# Patient Record
Sex: Female | Born: 1975 | Hispanic: No | Marital: Married | Smoking: Never smoker
Health system: Southern US, Community
[De-identification: ages and names within clinical notes are randomized; demographics above are authoritative.]

---

## 2001-10-23 ENCOUNTER — Emergency Department (HOSPITAL_COMMUNITY): Admission: EM | Admit: 2001-10-23 | Discharge: 2001-10-24 | Payer: Self-pay | Admitting: Emergency Medicine

## 2001-10-24 ENCOUNTER — Encounter: Payer: Self-pay | Admitting: Emergency Medicine

## 2001-10-25 ENCOUNTER — Emergency Department (HOSPITAL_COMMUNITY): Admission: EM | Admit: 2001-10-25 | Discharge: 2001-10-25 | Payer: Self-pay | Admitting: Emergency Medicine

## 2001-11-02 ENCOUNTER — Emergency Department (HOSPITAL_COMMUNITY): Admission: EM | Admit: 2001-11-02 | Discharge: 2001-11-02 | Payer: Self-pay | Admitting: Physical Therapy

## 2003-05-26 ENCOUNTER — Emergency Department (HOSPITAL_COMMUNITY): Admission: EM | Admit: 2003-05-26 | Discharge: 2003-05-26 | Payer: Self-pay | Admitting: Emergency Medicine

## 2005-04-15 ENCOUNTER — Ambulatory Visit (HOSPITAL_COMMUNITY): Admission: RE | Admit: 2005-04-15 | Discharge: 2005-04-15 | Payer: Self-pay | Admitting: Family Medicine

## 2005-04-15 ENCOUNTER — Emergency Department (HOSPITAL_COMMUNITY): Admission: EM | Admit: 2005-04-15 | Discharge: 2005-04-15 | Payer: Self-pay | Admitting: Family Medicine

## 2005-04-17 ENCOUNTER — Emergency Department (HOSPITAL_COMMUNITY): Admission: EM | Admit: 2005-04-17 | Discharge: 2005-04-17 | Payer: Self-pay | Admitting: Emergency Medicine

## 2005-04-20 ENCOUNTER — Emergency Department (HOSPITAL_COMMUNITY): Admission: EM | Admit: 2005-04-20 | Discharge: 2005-04-20 | Payer: Self-pay | Admitting: Emergency Medicine

## 2011-12-21 ENCOUNTER — Emergency Department (HOSPITAL_BASED_OUTPATIENT_CLINIC_OR_DEPARTMENT_OTHER)
Admission: EM | Admit: 2011-12-21 | Discharge: 2011-12-21 | Disposition: A | Discharge status: Home / Self Care | Payer: Self-pay | Attending: Emergency Medicine | Admitting: Emergency Medicine

## 2011-12-21 ENCOUNTER — Emergency Department (INDEPENDENT_AMBULATORY_CARE_PROVIDER_SITE_OTHER): Payer: Self-pay

## 2011-12-21 ENCOUNTER — Encounter (HOSPITAL_BASED_OUTPATIENT_CLINIC_OR_DEPARTMENT_OTHER): Payer: Self-pay

## 2011-12-21 DIAGNOSIS — D649 Anemia, unspecified: Secondary | ICD-10-CM | POA: Insufficient documentation

## 2011-12-21 DIAGNOSIS — J40 Bronchitis, not specified as acute or chronic: Secondary | ICD-10-CM | POA: Insufficient documentation

## 2011-12-21 DIAGNOSIS — R5383 Other fatigue: Secondary | ICD-10-CM

## 2011-12-21 DIAGNOSIS — R509 Fever, unspecified: Secondary | ICD-10-CM

## 2011-12-21 DIAGNOSIS — R0602 Shortness of breath: Secondary | ICD-10-CM | POA: Insufficient documentation

## 2011-12-21 DIAGNOSIS — E876 Hypokalemia: Secondary | ICD-10-CM | POA: Insufficient documentation

## 2011-12-21 DIAGNOSIS — R05 Cough: Secondary | ICD-10-CM

## 2011-12-21 LAB — CBC
HCT: 33.6 % — ABNORMAL LOW (ref 36.0–46.0)
Hemoglobin: 11.4 g/dL — ABNORMAL LOW (ref 12.0–15.0)
MCH: 27 pg (ref 26.0–34.0)
MCHC: 33.9 g/dL (ref 30.0–36.0)
RDW: 14.6 % (ref 11.5–15.5)

## 2011-12-21 LAB — PREGNANCY, URINE: Preg Test, Ur: NEGATIVE

## 2011-12-21 LAB — BASIC METABOLIC PANEL
Chloride: 104 mEq/L (ref 96–112)
GFR calc Af Amer: >90 mL/min (ref >90)
Potassium: 2.8 mEq/L — ABNORMAL LOW (ref 3.5–5.1)

## 2011-12-21 LAB — DIFFERENTIAL
Band Neutrophils: 11 % — ABNORMAL HIGH (ref 0–10)
Basophils Absolute: 0 10*3/uL (ref 0.0–0.1)
Basophils Relative: 0 % (ref 0–1)
Myelocytes: 0 %
Neutrophils Relative %: 61 % (ref 43–77)
Promyelocytes Absolute: 0 %

## 2011-12-21 MED ORDER — ALBUTEROL SULFATE HFA 108 (90 BASE) MCG/ACT IN AERS: 1.0000 | INHALATION_SPRAY | Freq: Four times a day (QID) | RESPIRATORY_TRACT | Status: AC | PRN

## 2011-12-21 MED ORDER — POTASSIUM CHLORIDE CRYS ER 20 MEQ PO TBCR: 20.0000 meq | EXTENDED_RELEASE_TABLET | Freq: Two times a day (BID) | ORAL | Status: AC

## 2011-12-21 MED ORDER — ALBUTEROL SULFATE HFA 108 (90 BASE) MCG/ACT IN AERS
2.0000 | INHALATION_SPRAY | Freq: Once | RESPIRATORY_TRACT | Status: AC
  Administered 2011-12-21: 2 via RESPIRATORY_TRACT

## 2011-12-21 MED ORDER — ALBUTEROL SULFATE HFA 108 (90 BASE) MCG/ACT IN AERS
INHALATION_SPRAY | RESPIRATORY_TRACT | Status: AC
  Filled 2011-12-21: qty 6.7

## 2011-12-21 MED ORDER — POTASSIUM CHLORIDE CRYS ER 20 MEQ PO TBCR
40.0000 meq | EXTENDED_RELEASE_TABLET | Freq: Once | ORAL | Status: AC
  Administered 2011-12-21: 40 meq via ORAL
  Filled 2011-12-21: qty 2

## 2011-12-21 MED ORDER — PRENATAL COMPLETE 14-0.4 MG PO TABS: 1.0000 | ORAL_TABLET | ORAL | Status: AC

## 2011-12-21 MED ORDER — SODIUM CHLORIDE 0.9 % IV SOLN: Freq: Once | INTRAVENOUS | Status: DC

## 2011-12-21 NOTE — ED Notes (Signed)
Pt c/o SOB onset 3 days ago.  Family states pt has dry cough for past 3 days as well.  Pt states she has been taking meds for "bacterial infection in stomach".  Pt states she has been nauseated, denies emesis.  Pt also states she has felt febrile with diarrhea for past month.  Denies blood.

## 2011-12-21 NOTE — ED Provider Notes (Signed)
History     CSN: [id number]  Arrival date & time 12/21/11  1110   First MD Initiated Contact with Patient 12/21/11 1212      Chief Complaint  Patient presents with  . Shortness of Breath    (Consider location/radiation/quality/duration/timing/severity/associated sxs/prior treatment) Patient is a 36 y.o. female presenting with cough. The history is provided by the patient. No language interpreter was used.  Cough This is a new problem. The current episode started more than 1 week ago. The problem occurs constantly. The problem has been gradually worsening. The cough is non-productive. The maximum temperature recorded prior to her arrival was 100 to 100.9 F. Associated symptoms include chest pain, rhinorrhea and sore throat. She has tried nothing for the symptoms. The treatment provided no relief. She is not a smoker. Her past medical history does not include bronchitis.  Pt complains of cough and weakness  History reviewed. No pertinent past medical history.  History reviewed. No pertinent past surgical history.  No family history on file.  History  Substance Use Topics  . Smoking status: Never Smoker   . Smokeless tobacco: Not on file  . Alcohol Use: No    OB History    Grav Para Term Preterm Abortions TAB SAB Ect Mult Living                  Review of Systems  HENT: Positive for sore throat and rhinorrhea.   Respiratory: Positive for cough.   Cardiovascular: Positive for chest pain.  All other systems reviewed and are negative.    Allergies  Review of patient's allergies indicates no known allergies.  Home Medications   Current Outpatient Rx  Name Route Sig Dispense Refill  . AMOXICILLIN 500 MG PO CAPS Oral Take 500 mg by mouth 3 (three) times daily.    Marland Kitchen CIPROFLOXACIN HCL 500 MG PO TABS Oral Take 500 mg by mouth 2 (two) times daily.    . IBUPROFEN 800 MG PO TABS Oral Take 800 mg by mouth 3 (three) times daily.    Marland Kitchen OMEPRAZOLE 20 MG PO CPDR Oral Take 20 mg by  mouth daily.      BP 106/61  Pulse 98  Temp(Src) 100.8 F (38.2 C) (Oral)  SpO2 99%  LMP 12/08/2011  Physical Exam  Nursing note and vitals reviewed. Constitutional: She appears well-developed and well-nourished.  HENT:  Head: Normocephalic and atraumatic.  Right Ear: External ear normal.  Left Ear: External ear normal.  Nose: Nose normal.  Mouth/Throat: Oropharynx is clear and moist.  Eyes: Conjunctivae are normal. Pupils are equal, round, and reactive to light.  Neck: Normal range of motion. Neck supple.  Cardiovascular: Normal rate.   Pulmonary/Chest: Effort normal.  Abdominal: Soft.  Musculoskeletal: Normal range of motion.  Neurological: She is alert.  Skin: Skin is warm.    ED Course  Procedures (including critical care time)  Labs Reviewed  CBC - Abnormal; Notable for the following:    Hemoglobin 11.4 (*)    HCT 33.6 (*)    All other components within normal limits  DIFFERENTIAL - Abnormal; Notable for the following:    Monocytes Relative 14 (*)    Band Neutrophils 11 (*)    All other components within normal limits  BASIC METABOLIC PANEL - Abnormal; Notable for the following:    Potassium 2.8 (*) REPEATED TO VERIFY   All other components within normal limits  PREGNANCY, URINE   Dg Chest 2 View  12/21/2011  *RADIOLOGY REPORT*  Clinical Data: Fever, weakness, dry cough  CHEST - 2 VIEW  Comparison: None.  Findings: Normal cardiac silhouette and mediastinal contours.  No focal parenchymal opacities.  No pleural effusion or pneumothorax. No acute osseous abnormalities.  IMPRESSION: No acute cardiopulmonary disease.  Specifically, no evidence of pneumonia.  Original Report Authenticated By: Waynard Reeds, M.D.     No diagnosis found.    MDM  Pt given Iv fluids,          Langston Masker, [place] 12/21/11 1512

## 2011-12-21 NOTE — ED Notes (Signed)
RT assessed in triage for SOB = none noted. Family stated she had been sick for a few days, weak cough. BBS diminished but clear. RT will continue to monitor

## 2011-12-22 NOTE — ED Provider Notes (Signed)
Medical screening examination/treatment/procedure(s) were performed by non-physician practitioner and as supervising physician I was immediately available for consultation/collaboration.   Gwyneth Sprout, MD 12/22/11 [phone]

## 2013-07-02 IMAGING — CR DG CHEST 2V
2 series · 2 of 2 positions shown · non-contrast
Comparison: None.

CLINICAL DATA: Fever, weakness, dry cough

CHEST - 2 VIEW

[w chest pa]
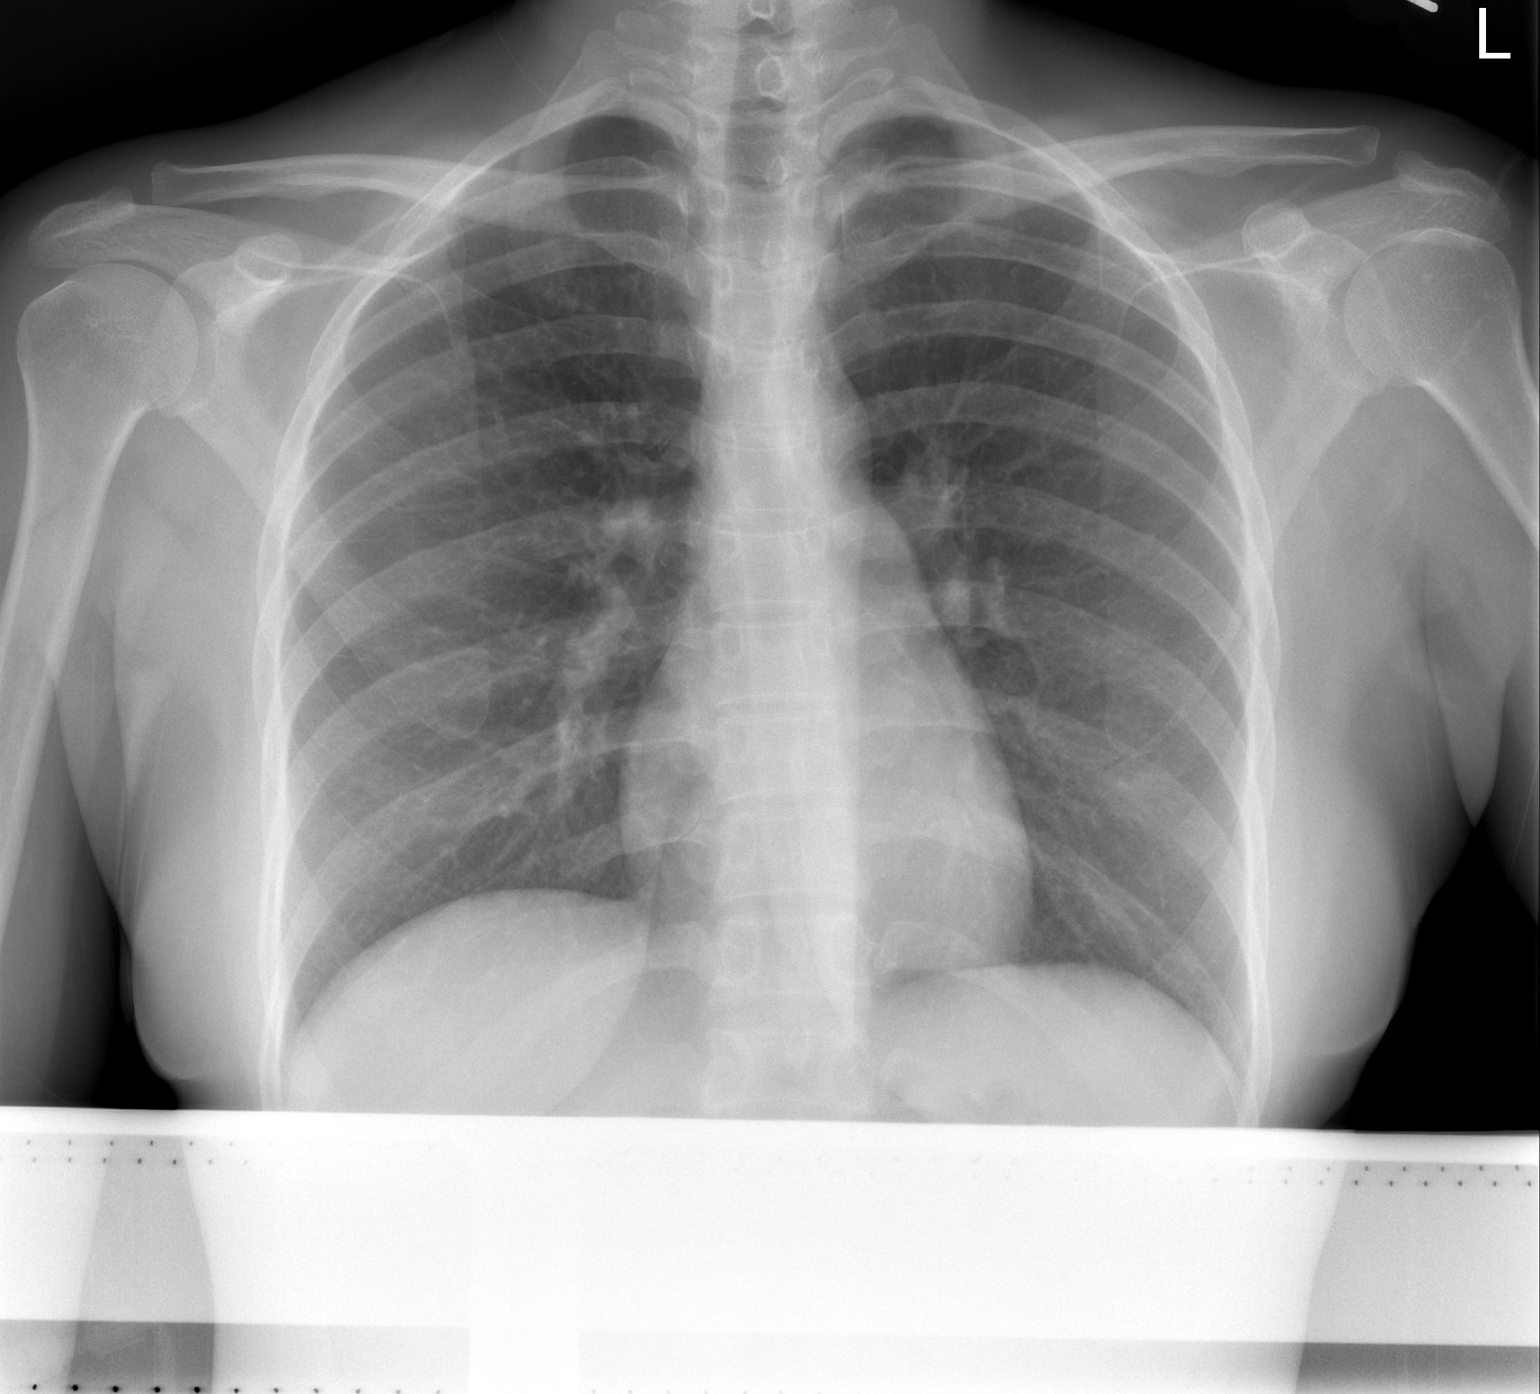

[w chest lat]
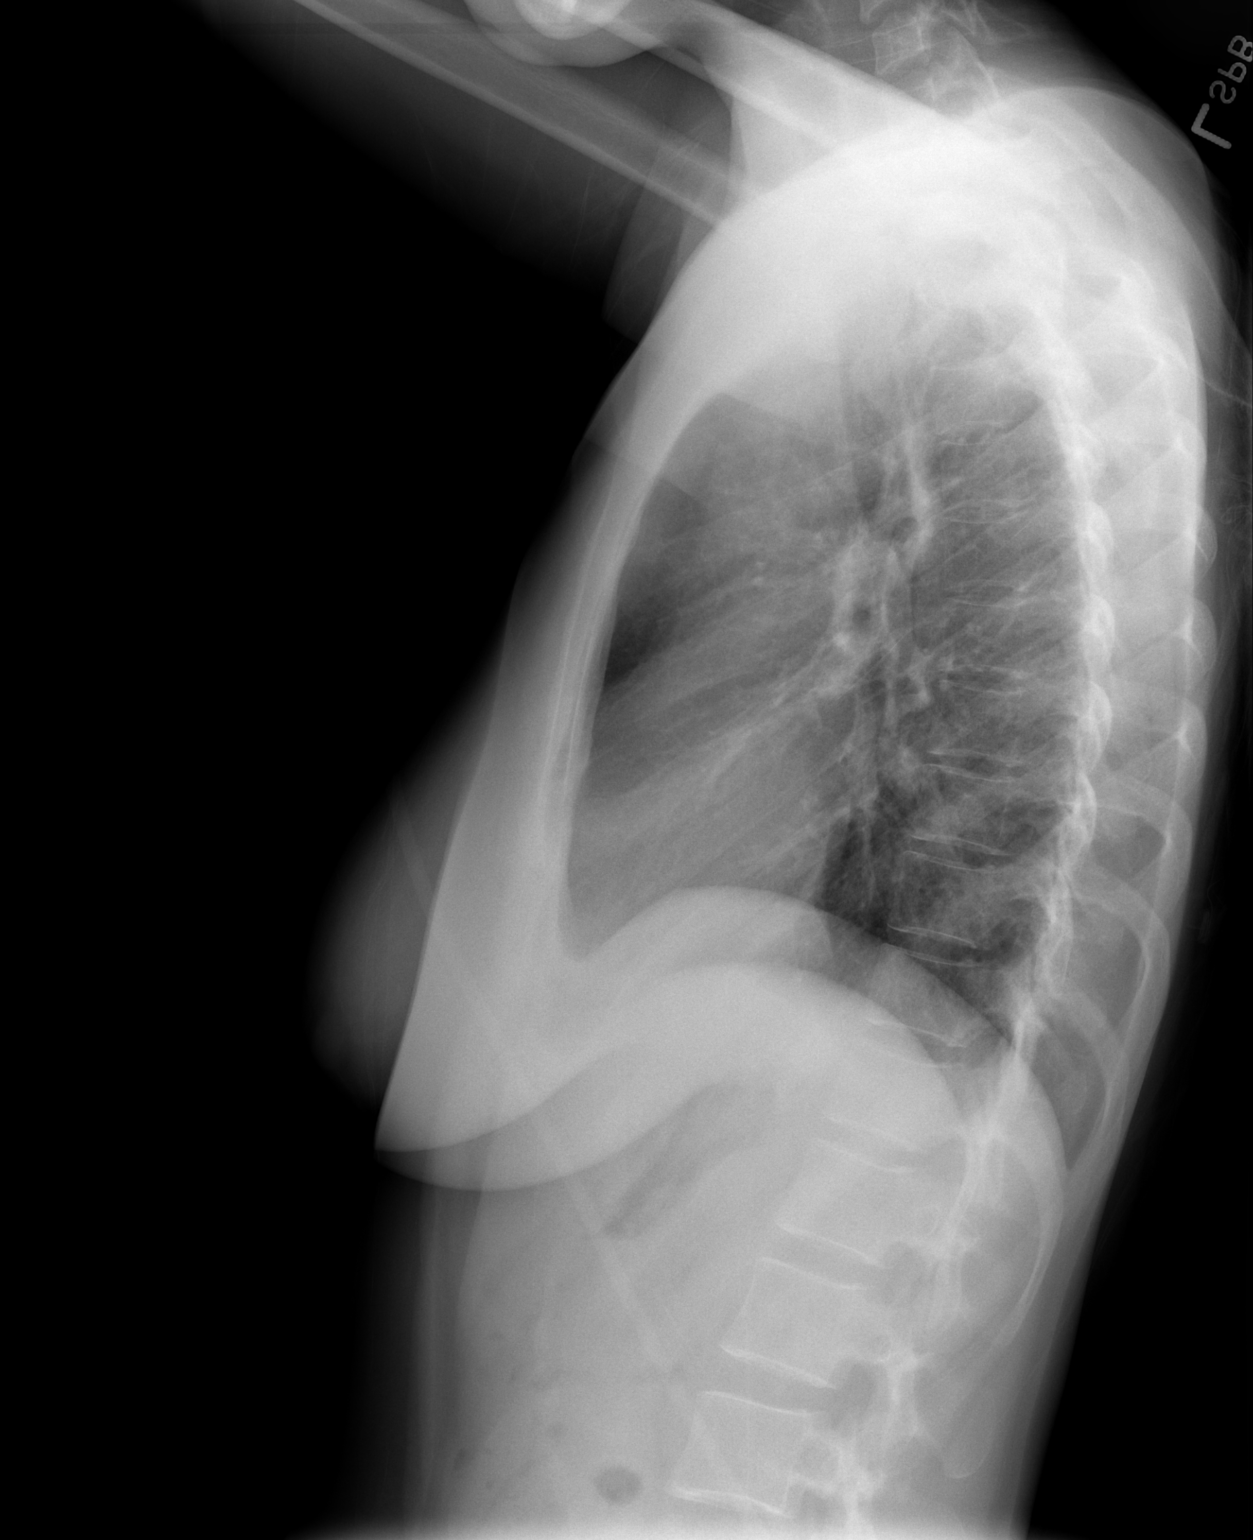

[2 of 2 positions shown; findings below may reference images not displayed]

FINDINGS: Normal cardiac silhouette and mediastinal contours.  No
focal parenchymal opacities.  No pleural effusion or pneumothorax.
No acute osseous abnormalities.
IMPRESSION: No acute cardiopulmonary disease.  Specifically, no evidence of
pneumonia.

## 2015-04-24 ENCOUNTER — Encounter (HOSPITAL_COMMUNITY): Payer: Self-pay | Admitting: Emergency Medicine

## 2015-04-24 ENCOUNTER — Emergency Department (INDEPENDENT_AMBULATORY_CARE_PROVIDER_SITE_OTHER)
Admission: EM | Admit: 2015-04-24 | Discharge: 2015-04-24 | Disposition: A | Discharge status: Home / Self Care | Payer: Self-pay | Source: Home / Self Care | Attending: Family Medicine | Admitting: Family Medicine

## 2015-04-24 ENCOUNTER — Emergency Department (INDEPENDENT_AMBULATORY_CARE_PROVIDER_SITE_OTHER): Payer: Self-pay

## 2015-04-24 DIAGNOSIS — J40 Bronchitis, not specified as acute or chronic: Secondary | ICD-10-CM

## 2015-04-24 MED ORDER — IPRATROPIUM-ALBUTEROL 0.5-2.5 (3) MG/3ML IN SOLN
RESPIRATORY_TRACT | Status: AC
  Filled 2015-04-24: qty 3

## 2015-04-24 MED ORDER — HYDROCODONE-ACETAMINOPHEN 5-325 MG PO TABS: 0.5000 | ORAL_TABLET | Freq: Every evening | ORAL | Status: AC | PRN

## 2015-04-24 MED ORDER — ALBUTEROL SULFATE HFA 108 (90 BASE) MCG/ACT IN AERS
2.0000 | INHALATION_SPRAY | Freq: Once | RESPIRATORY_TRACT | Status: AC
  Administered 2015-04-24: 2 via RESPIRATORY_TRACT

## 2015-04-24 MED ORDER — ACETAMINOPHEN 325 MG PO TABS
975.0000 mg | ORAL_TABLET | Freq: Once | ORAL | Status: AC
  Administered 2015-04-24: 975 mg via ORAL

## 2015-04-24 MED ORDER — ACETAMINOPHEN 325 MG PO TABS
ORAL_TABLET | ORAL | Status: AC
  Filled 2015-04-24: qty 3

## 2015-04-24 MED ORDER — PREDNISONE 50 MG PO TABS: 50.0000 mg | ORAL_TABLET | Freq: Every day | ORAL | Status: AC

## 2015-04-24 MED ORDER — IPRATROPIUM-ALBUTEROL 0.5-2.5 (3) MG/3ML IN SOLN
3.0000 mL | Freq: Once | RESPIRATORY_TRACT | Status: AC
  Administered 2015-04-24: 3 mL via RESPIRATORY_TRACT

## 2015-04-24 MED ORDER — ALBUTEROL SULFATE HFA 108 (90 BASE) MCG/ACT IN AERS
INHALATION_SPRAY | RESPIRATORY_TRACT | Status: AC
  Filled 2015-04-24: qty 6.7

## 2015-04-24 MED ORDER — AEROCHAMBER PLUS W/MASK MISC
Status: AC
  Filled 2015-04-24: qty 1

## 2015-04-24 NOTE — ED Notes (Signed)
Back pain, cough, fever that started on saturday

## 2015-04-24 NOTE — Discharge Instructions (Signed)
Thank you for coming in today. °Call or go to the emergency room if you get worse, have trouble breathing, have chest pains, or palpitations.  ° °Bronquitis aguda °(Acute Bronchitis) °La bronquitis es una inflamación de las vías respiratorias que se extienden desde la tráquea hasta los pulmones (bronquios). La inflamación produce la formación de mucosidad. Esto produce tos, que es el síntoma más frecuente de la bronquitis.  °Cuando la bronquitis es aguda, generalmente comienza de manera súbita y desaparece luego de un par de semanas. El hábito de fumar, las alergias y el asma pueden empeorar la bronquitis. Los episodios repetidos de bronquitis pueden causar más problemas pulmonares.  °CAUSAS °La causa más frecuente de bronquitis aguda es el mismo virus que produce el resfrío. El virus puede propagarse de una persona a la otra (contagioso) a través de la tos y los estornudos, y al tocar objetos contaminados. °SIGNOS Y SÍNTOMAS  °· Tos. °· Fiebre. °· Tos con mucosidad. °· Dolores en el cuerpo. °· Congestión en el pecho. °· Escalofríos. °· Falta de aire. °· Dolor de garganta. °DIAGNÓSTICO  °La bronquitis aguda en general se diagnostica con un examen físico. El médico también le hará preguntas sobre su historia clínica. En algunos casos se indican otros estudios, como radiografías, para descartar otras enfermedades.  °TRATAMIENTO  °La bronquitis aguda generalmente desaparece en un par de semanas. Con frecuencia, no es necesario realizar un tratamiento. Los medicamentos se indican para aliviar la fiebre o la tos. Generalmente, no es necesario el uso de antibióticos, pero pueden recetarse en ciertas ocasiones. En algunos casos, se recomienda el uso de un inhalador para mejorar la falta de aire y controlar la tos. Un vaporizador de aire frío podrá ayudarlo a disolver las secreciones bronquiales y facilitar su eliminación.  °INSTRUCCIONES PARA EL CUIDADO EN EL HOGAR  °· Descanse lo suficiente. °· Beba líquidos en abundancia  para mantener la orina de color claro o amarillo pálido (excepto que padezca una enfermedad que requiera la restricción de líquidos). El aumento de líquidos puede ayudar a que las secreciones respiratorias (esputo) sean menos espesas y a reducir la congestión del pecho, y evitará la deshidratación. °· Tome los medicamentos solamente como se lo haya indicado el médico. °· Si le recetaron antibióticos, asegúrese de terminarlos, incluso si comienza a sentirse mejor. °· Evite fumar o aspirar el humo de otros fumadores. La exposición al humo del cigarrillo o a irritantes químicos hará que la bronquitis empeore. Si fuma, considere el uso de goma de mascar o la aplicación de parches en la piel que contengan nicotina para aliviar los síntomas de abstinencia. Si deja de fumar, sus pulmones se curarán más rápido. °· Reduzca la probabilidad de otro episodio de bronquitis aguda lavando sus manos con frecuencia, evitando a las personas que tengan síntomas y tratando de no tocarse las manos con la boca, la nariz o los ojos. °· Concurra a todas las visitas de control como se lo haya indicado el médico. °SOLICITE ATENCIÓN MÉDICA SI: °Los síntomas no mejoran después de una semana de tratamiento.  °SOLICITE ATENCIÓN MÉDICA DE INMEDIATO SI: °· Comienza a tener fiebre o escalofríos cada vez más intensos. °· Siente dolor en el pecho. °· Le falta el aire de manera preocupante. °· La flema tiene sangre. °· Se deshidrata. °· Se desmaya o siente que va a desmayarse de forma repetida. °· Tiene vómitos que se repiten. °· Tiene un dolor de cabeza intenso. °ASEGÚRESE DE QUE:  °· Comprende estas instrucciones. °· Controlará su afección. °· Recibirá ayuda   de inmediato si no mejora o si empeora. °Document Released: 11/11/2005 Document Revised: 03/28/2014 °[employer]® Patient Information ©2015 [employer]. This information is not intended to replace advice given to you by your health care provider. Make sure you discuss any questions you have with  your health care provider. ° °

## 2015-04-24 NOTE — ED Provider Notes (Signed)
Susan Espinoza is a 39 y.o. female who presents to Urgent Care today for cough fever or back pain present for 3 days. Symptoms consistent with previous episodes of pneumonia. No vomiting or diarrhea. She's tried some home remedies which have not helped. She feels well otherwise. No chest pains or palpitations.   History reviewed. No pertinent past medical history. History reviewed. No pertinent past surgical history. History  Substance Use Topics  . Smoking status: Never Smoker   . Smokeless tobacco: Not on file  . Alcohol Use: No   ROS as above Medications: Current Facility-Administered Medications  Medication Dose Route Frequency Provider Last Rate Last Dose  . albuterol (PROVENTIL HFA;VENTOLIN HFA) 108 (90 BASE) MCG/ACT inhaler 2 puff  2 puff Inhalation Once Rodolph BongEvan S Shelvy Heckert, MD       Current Outpatient Prescriptions  Medication Sig Dispense Refill  . albuterol (PROVENTIL HFA;VENTOLIN HFA) 108 (90 BASE) MCG/ACT inhaler Inhale 1-2 puffs into the lungs every 6 (six) hours as needed for wheezing. 1 Inhaler 0  . HYDROcodone-acetaminophen (NORCO/VICODIN) 5-325 MG per tablet Take 0.5 tablets by mouth at bedtime as needed (cough). 6 tablet 0  . omeprazole (PRILOSEC) 20 MG capsule Take 20 mg by mouth daily.    . potassium chloride SA (K-DUR,KLOR-CON) 20 MEQ tablet Take 1 tablet (20 mEq total) by mouth 2 (two) times daily. 20 tablet 0  . predniSONE (DELTASONE) 50 MG tablet Take 1 tablet (50 mg total) by mouth daily. 5 tablet 0  . Prenatal Vit-Fe Fumarate-FA (PRENATAL COMPLETE) 14-0.4 MG TABS Take 1 tablet by mouth 1 day or 1 dose. 60 each 0   No Known Allergies   Exam:  BP 133/80 mmHg  Pulse 80  Temp(Src) 99.4 F (37.4 C) (Oral)  Resp 20  SpO2 100%  LMP 04/17/2015 Gen: Well NAD HEENT: EOMI,  MMM Lungs: Normal work of breathing. Coarse breath sounds bilaterally Heart: RRR no MRG Abd: NABS, Soft. Nondistended, Nontender Exts: Brisk capillary refill, warm and well perfused.    Patient was given a 2.5/0.5 mg DuoNeb nebulizer treatment as well as 975 mg oral Tylenol and felt better  No results found for this or any previous visit (from the past 24 hour(s)). Dg Chest 2 View  04/24/2015   CLINICAL DATA:  Acute onset of cough, congestion and difficulty breathing. Fever. Initial encounter.  EXAM: CHEST  2 VIEW  COMPARISON:  Chest radiograph performed 12/21/2011  FINDINGS: The lungs are well-aerated and clear. There is no evidence of focal opacification, pleural effusion or pneumothorax.  The heart is normal in size; the mediastinal contour is within normal limits. No acute osseous abnormalities are seen.  IMPRESSION: No acute cardiopulmonary process seen.   Electronically Signed   By: Roanna RaiderJeffery  Chang M.D.   On: 04/24/2015 19:13    Assessment and Plan: 39 y.o. female with bronchitis treat with prednisone and albuterol and hydrocodone for cough suppression  Discussed warning signs or symptoms. Please see discharge instructions. Patient expresses understanding.     Rodolph BongEvan S Griselle Rufer, MD 04/24/15 [phone]

## 2018-02-20 ENCOUNTER — Other Ambulatory Visit: Payer: Self-pay | Admitting: Family Medicine

## 2018-02-20 DIAGNOSIS — R102 Pelvic and perineal pain: Secondary | ICD-10-CM

## 2018-02-26 ENCOUNTER — Ambulatory Visit
Admission: RE | Admit: 2018-02-26 | Discharge: 2018-02-26 | Disposition: A | Discharge status: Home / Self Care | Payer: Self-pay | Source: Ambulatory Visit | Attending: Family Medicine | Admitting: Family Medicine

## 2018-02-26 DIAGNOSIS — R102 Pelvic and perineal pain: Secondary | ICD-10-CM | POA: Insufficient documentation

## 2019-09-08 IMAGING — US US PELVIS COMPLETE TRANSABD/TRANSVAG
1 series · 13 of 25 positions shown · non-contrast
Comparison: None

CLINICAL DATA: Patient with left greater than right pelvic pain.
Vaginal bleeding.



[Series 1: us pelvis complete transabd/transvag · 91 acquisitions, 13 frames shown]
[im 1/91]
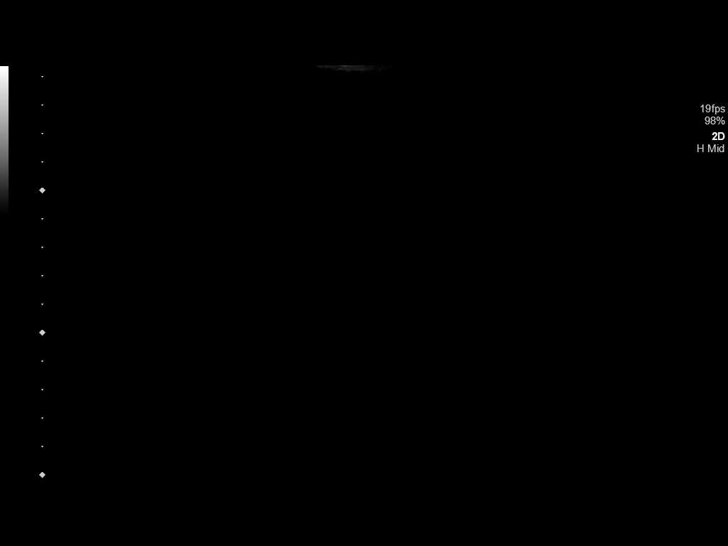
[im 8/91]
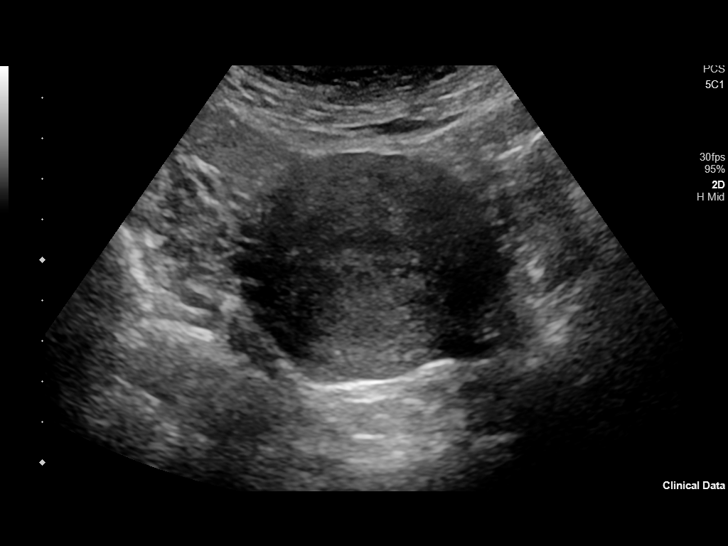
[im 16/91]
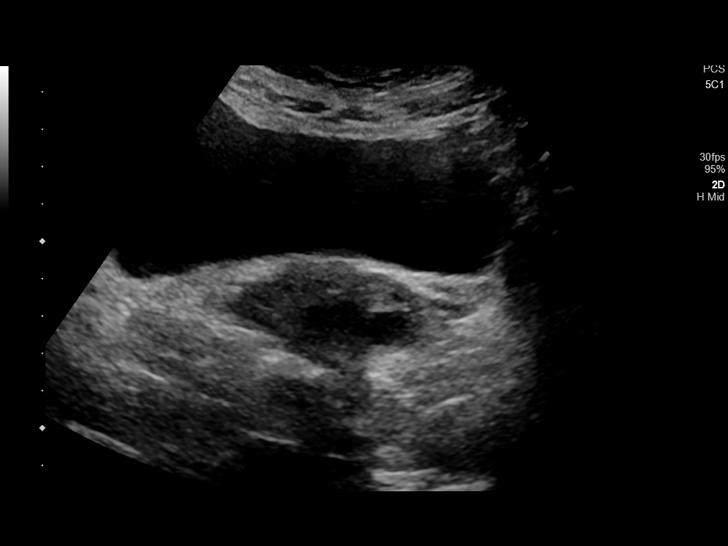
[im 23/91]
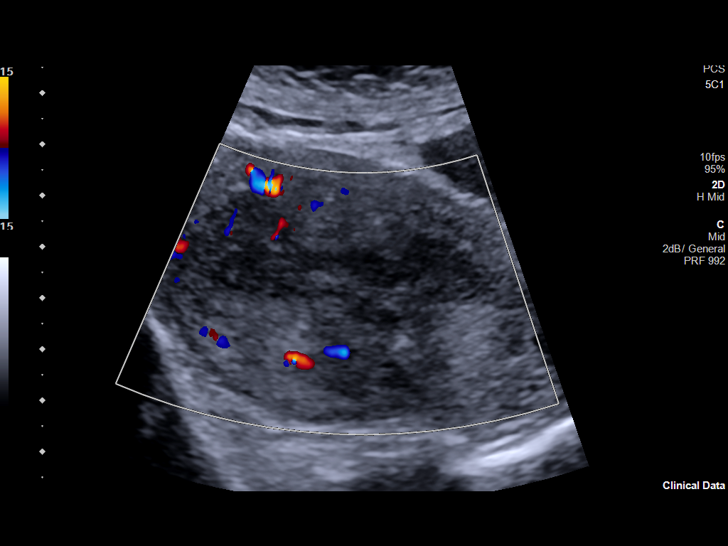
[im 31/91]
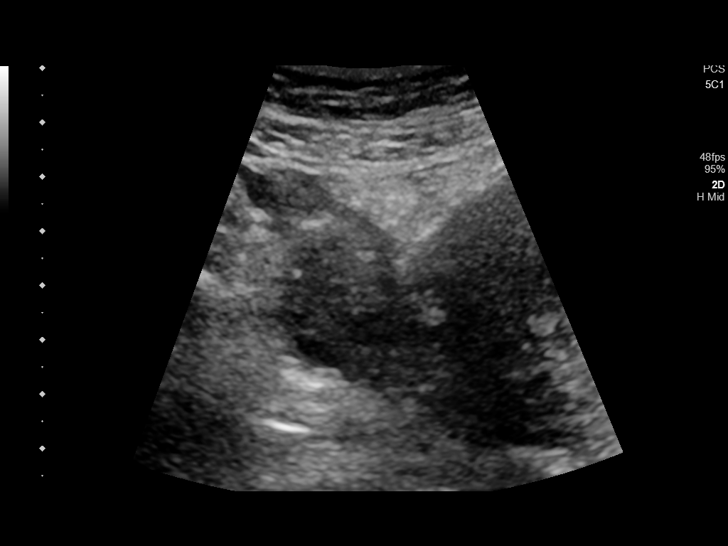
[im 38/91]
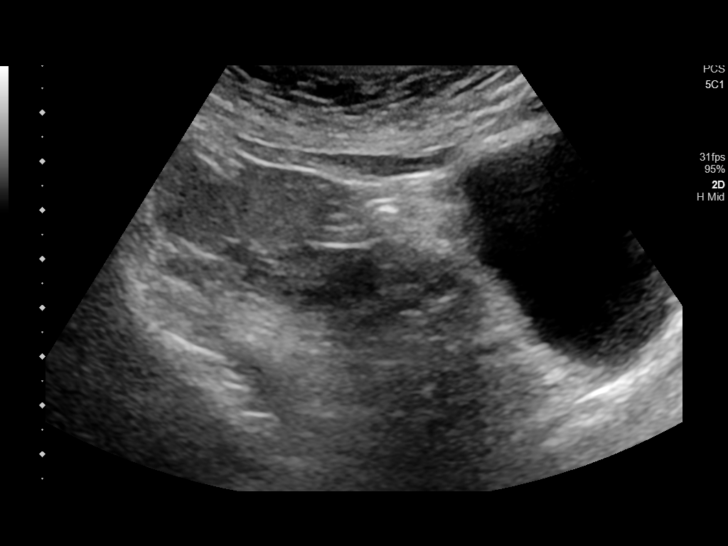
[im 46/91]
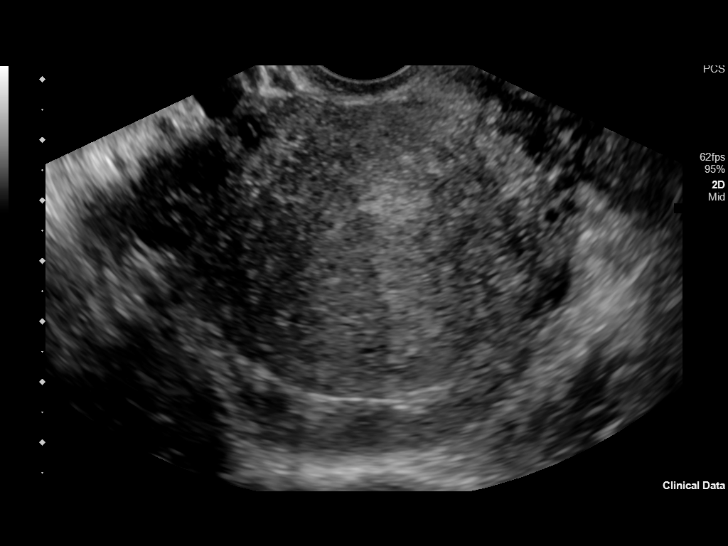
[im 53/91]
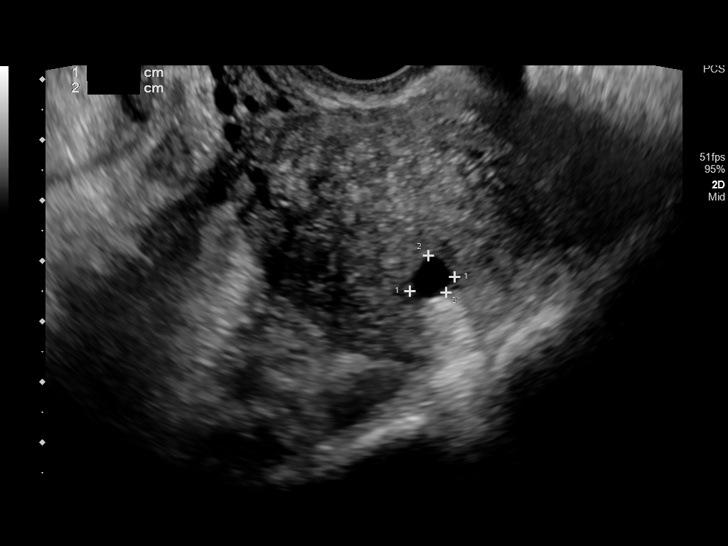
[im 61/91]
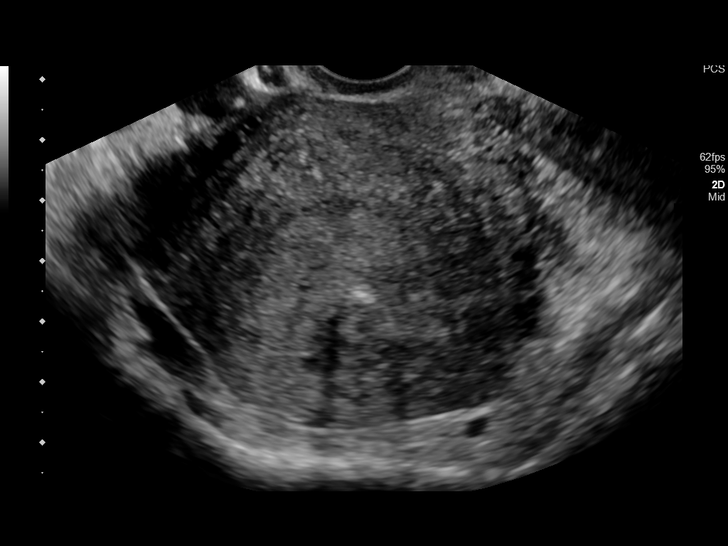
[im 68/91]
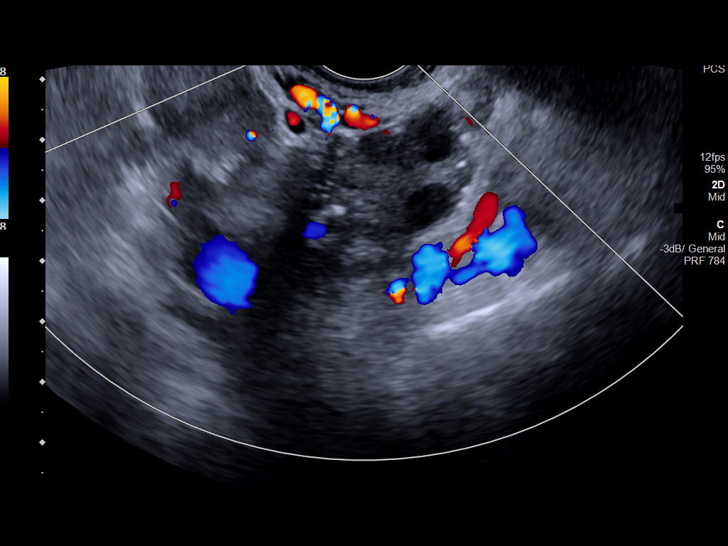
[im 76/91]
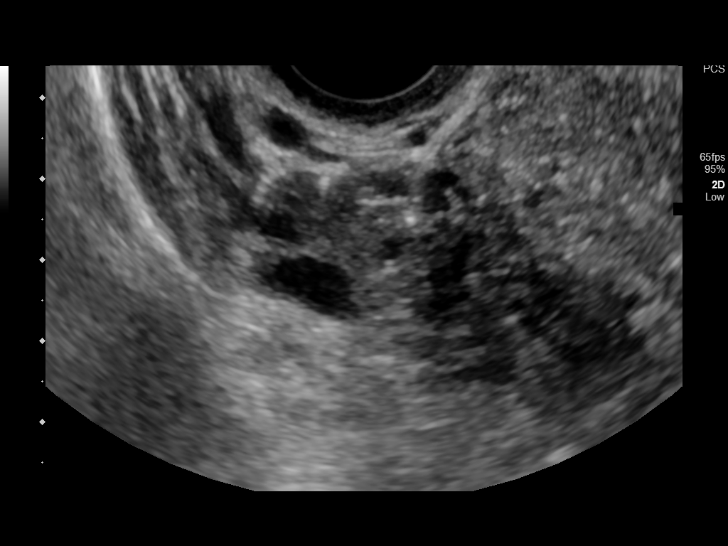
[im 83/91]
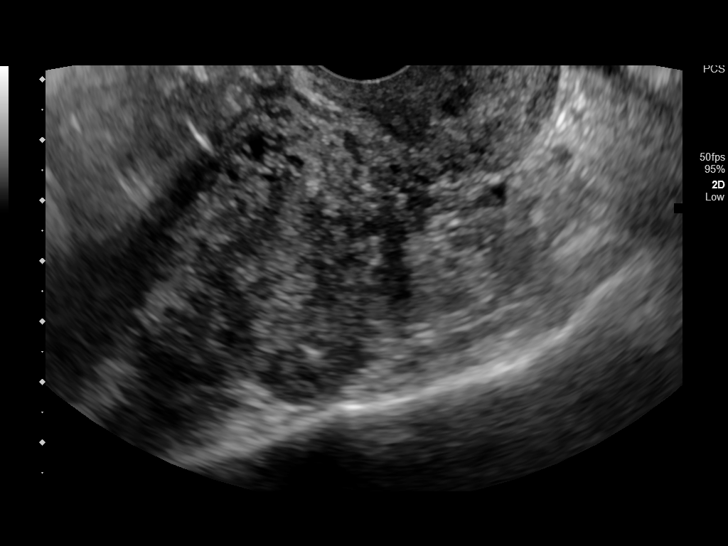
[im 91/91]
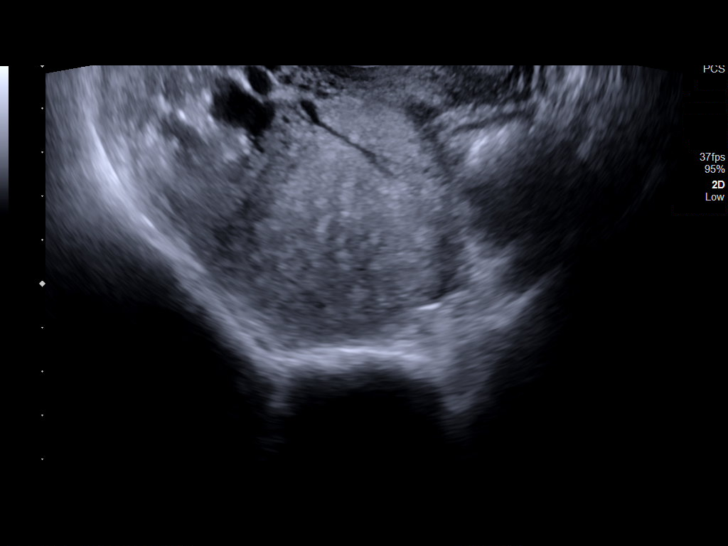

[13 of 25 positions shown; findings below may reference images not displayed]

FINDINGS: Uterus

Measurements: 9.9 x 5.3 x 6.2 cm. There is a subcentimeter near
anechoic lesion within the anterior uterine fundus, potentially
representing a small cyst, too small to characterize.

Endometrium

Thickness: 15 mm. There is heterogeneity of the adjacent myometrium.

Right ovary

Measurements: 4.2 x 2.2 x 2.8 cm. Normal appearance/no adnexal mass.

Left ovary

Measurements: 1.8 x 1.2 x 2.2 cm. Normal appearance/no adnexal mass.

Other findings

No abnormal free fluid.
IMPRESSION: The endometrium measures 15 mm. If bleeding remains unresponsive to
hormonal or medical therapy, sonohysterogram should be considered
for focal lesion work-up. (Ref: Radiological Reasoning: Algorithmic
Workup of Abnormal Vaginal Bleeding with Endovaginal Sonography and
Sonohysterography. AJR 1993; 191:S68-73)

There is heterogeneity of myometrium raising the possibility of
adenomyosis.
